# Patient Record
Sex: Female | Born: 1995 | Race: Black or African American | Hispanic: No | Marital: Single | Smoking: Current every day smoker
Health system: Southern US, Community
[De-identification: ages and names within clinical notes are randomized; demographics above are authoritative.]

---

## 2016-06-07 ENCOUNTER — Emergency Department (HOSPITAL_COMMUNITY): Payer: Self-pay

## 2016-06-07 ENCOUNTER — Encounter (HOSPITAL_COMMUNITY): Payer: Self-pay | Admitting: Emergency Medicine

## 2016-06-07 ENCOUNTER — Emergency Department (HOSPITAL_COMMUNITY)
Admission: EM | Admit: 2016-06-07 | Discharge: 2016-06-07 | Disposition: A | Discharge status: Home / Self Care | Payer: Self-pay | Attending: Emergency Medicine | Admitting: Emergency Medicine

## 2016-06-07 DIAGNOSIS — R52 Pain, unspecified: Secondary | ICD-10-CM

## 2016-06-07 DIAGNOSIS — Z79899 Other long term (current) drug therapy: Secondary | ICD-10-CM | POA: Insufficient documentation

## 2016-06-07 DIAGNOSIS — F17210 Nicotine dependence, cigarettes, uncomplicated: Secondary | ICD-10-CM | POA: Insufficient documentation

## 2016-06-07 DIAGNOSIS — Z3A01 Less than 8 weeks gestation of pregnancy: Secondary | ICD-10-CM | POA: Insufficient documentation

## 2016-06-07 DIAGNOSIS — O99011 Anemia complicating pregnancy, first trimester: Secondary | ICD-10-CM | POA: Insufficient documentation

## 2016-06-07 DIAGNOSIS — O039 Complete or unspecified spontaneous abortion without complication: Secondary | ICD-10-CM

## 2016-06-07 DIAGNOSIS — O200 Threatened abortion: Secondary | ICD-10-CM | POA: Insufficient documentation

## 2016-06-07 LAB — HCG, QUANTITATIVE, PREGNANCY: HCG, BETA CHAIN, QUANT, S: 7491 m[IU]/mL — AB (ref <5)

## 2016-06-07 LAB — CBC WITH DIFFERENTIAL/PLATELET
Basophils Absolute: 0 10*3/uL (ref 0.0–0.1)
Basophils Relative: 0 %
EOS PCT: 1 %
Eosinophils Absolute: 0.1 10*3/uL (ref 0.0–0.7)
HCT: 33.5 % — ABNORMAL LOW (ref 36.0–46.0)
HEMOGLOBIN: 10.7 g/dL — AB (ref 12.0–15.0)
LYMPHS ABS: 2 10*3/uL (ref 0.7–4.0)
LYMPHS PCT: 20 %
MCH: 20.9 pg — AB (ref 26.0–34.0)
MCHC: 31.9 g/dL (ref 30.0–36.0)
MCV: 65.3 fL — AB (ref 78.0–100.0)
MONO ABS: 0.8 10*3/uL (ref 0.1–1.0)
MONOS PCT: 8 %
Neutro Abs: 7.2 10*3/uL (ref 1.7–7.7)
Neutrophils Relative %: 71 %
Platelets: 409 10*3/uL — ABNORMAL HIGH (ref 150–400)
RBC: 5.13 MIL/uL — ABNORMAL HIGH (ref 3.87–5.11)
RDW: 20 % — ABNORMAL HIGH (ref 11.5–15.5)
WBC: 10.2 10*3/uL (ref 4.0–10.5)

## 2016-06-07 LAB — I-STAT BETA HCG BLOOD, ED (MC, WL, AP ONLY)

## 2016-06-07 LAB — URINE MICROSCOPIC-ADD ON

## 2016-06-07 LAB — WET PREP, GENITAL
SPERM: NONE SEEN
TRICH WET PREP: NONE SEEN
YEAST WET PREP: NONE SEEN

## 2016-06-07 LAB — URINALYSIS, ROUTINE W REFLEX MICROSCOPIC
Bilirubin Urine: NEGATIVE
GLUCOSE, UA: NEGATIVE mg/dL
Ketones, ur: 15 mg/dL — AB
LEUKOCYTES UA: NEGATIVE
NITRITE: NEGATIVE
PH: 5.5 (ref 5.0–8.0)
Protein, ur: NEGATIVE mg/dL
Specific Gravity, Urine: >1.03 — ABNORMAL HIGH (ref 1.005–1.030)

## 2016-06-07 LAB — PREGNANCY, URINE: Preg Test, Ur: POSITIVE — AB

## 2016-06-07 LAB — ABO/RH: ABO/RH(D): B POS

## 2016-06-07 NOTE — Discharge Instructions (Signed)
[facility]iscar[facility] tomorrow for an appointment in 2 days. You need a repeat blood test and US at that time. Return to the ED if you develop worsening pelvic pain, vaginal bleeding, dizziness or any other concerns. A miscarriage is the sudden loss of an unborn baby (fetus) before the 20th week of pregnancy. Most miscarriages happen in the first 3 months of pregnancy. Sometimes, it happens before a woman even knows she is pregnant. A miscarriage is also called a "spontaneous miscarriage" or "early pregnancy loss." Having a miscarriage can be an emotional experience. Talk with your caregiver about any questions you may have about miscarrying, the grieving process, and your future pregnancy plans. CAUSES   Problems with the fetal chromosomes that make it impossible for the baby to develop normally. Problems with the baby's genes or chromosomes are most often the result of errors that occur, by chance, as the embryo divides and grows. The problems are not inherited from the parents.  Infection of the cervix or uterus.   Hormone problems.   Problems with the cervix, such as having an incompetent cervix. This is when the tissue in the cervix is not strong enough to hold the pregnancy.   Problems with the uterus, such as an abnormally shaped uterus, uterine fibroids, or congenital abnormalities.   Certain medical conditions.   Smoking, drinking alcohol, or taking illegal drugs.   Trauma.  Often, the cause of a miscarriage is unknown.  SYMPTOMS   Vaginal bleeding or spotting, with or without cramps or pain.  Pain or cramping in the abdomen or lower back.  Passing fluid, tissue, or blood clots from the vagina. DIAGNOSIS  Your caregiver will perform a physical exam. You may also have an ultrasound to confirm the miscarriage. Blood or urine tests may also be ordered. TREATMENT   Sometimes, treatment is not necessary if you naturally pass all the fetal tissue that was in the  uterus. If some of the fetus or placenta remains in the body (incomplete miscarriage), tissue left behind may become infected and must be removed. Usually, a dilation and curettage (D and C) procedure is performed. During a D and C procedure, the cervix is widened (dilated) and any remaining fetal or placental tissue is gently removed from the uterus.  Antibiotic medicines are prescribed if there is an infection. Other medicines may be given to reduce the size of the uterus (contract) if there is a lot of bleeding.  If you have Rh negative blood and your baby was Rh positive, you will need a Rh immunoglobulin shot. This shot will protect any future baby from having Rh blood problems in future pregnancies. HOME CARE INSTRUCTIONS   Your caregiver may order bed rest or may allow you to continue light activity. Resume activity as directed by your caregiver.  Have someone help with home and family responsibilities during this time.   Keep track of the number of sanitary pads you use each day and how soaked (saturated) they are. Write down this information.   Do not use tampons. Do not douche or have sexual intercourse until approved by your caregiver.   Only take over-the-counter or prescription medicines for pain or discomfort as directed by your caregiver.   Do not take aspirin. Aspirin can cause bleeding.   Keep all follow-up appointments with your caregiver.   If you or your partner have problems with grieving, talk to your caregiver or seek counseling to help cope with the pregnancy loss. Allow enough time  to grieve before trying to get pregnant again.  SEEK IMMEDIATE MEDICAL CARE IF:   You have severe cramps or pain in your back or abdomen.  You have a fever.  You pass large blood clots (walnut-sized or larger) ortissue from your vagina. Save any tissue for your caregiver to inspect.   Your bleeding increases.   You have a thick, bad-smelling vaginal discharge.  You  become lightheaded, weak, or you faint.   You have chills.  MAKE SURE YOU:  Understand these instructions.  Will watch your condition.  Will get help right away if you are not doing well or get worse.   This information is not intended to replace advice given to you by your health care provider. Make sure you discuss any questions you have with your health care provider.   Document Released: 05/30/2001 Document Revised: 03/31/2013 Document Reviewed: 01/23/2012 Elsevier Interactive Patient Education [employer]

## 2016-06-07 NOTE — ED Notes (Addendum)
Pt c/o abd cramping and vaginal bleeding x 1 hour. Pt states she has not seen obgyn about pregnancy, her appointment is July 10th.

## 2016-06-07 NOTE — ED Provider Notes (Signed)
No IUP seen on US.  HCG 7500.  Rh+  D/w Dr. Emelda FearFerguson.  Suspect miscarriage, doubt ectopic pregnancy.  He recommends follow up in the office in 2 days. Repeat HCG and US at that time.  Patient stable, denies abdominal pain. Understands importance of follow up. Need to see OB in 2 days.  Return precautions discussed.  Glynn OctaveStephen Nami Strawder, MD 06/07/16 2303

## 2016-06-07 NOTE — ED Provider Notes (Signed)
CSN: [id number]     Arrival date & time 06/07/16  1948 History   First MD Initiated Contact with Patient 06/07/16 2012     Chief Complaint  Patient presents with  . Vaginal Bleeding     (Consider location/radiation/quality/duration/timing/severity/associated sxs/prior Treatment) HPI patient developed vaginal bleeding and lower abdominal nonradiating crampy pain 2 hours ago. She states she is used 1 pad since onset of bleeding. She is presently [redacted] weeks pregnant by dates. [facility]. Nothing makes symptoms better or worse. No other associated symptoms. Denies lightheadedness. Nothing makes symptoms better or worse. No fever. No urinary symptoms. No other associated symptoms  History reviewed. No pertinent past medical history. Past medical history negative History reviewed. No pertinent past surgical history. History reviewed. No pertinent family history. Social History  Substance Use Topics  . Smoking status: Current Every Day Smoker    Types: Cigarettes  . Smokeless tobacco: None  . Alcohol Use: No  Denies drug use OB History    Gravida Para Term Preterm AB TAB SAB Ectopic Multiple Living   1              Review of Systems  Constitutional: Negative.   HENT: Negative.   Respiratory: Negative.   Cardiovascular: Negative.   Gastrointestinal: Positive for abdominal pain.  Genitourinary: Positive for vaginal bleeding.       Pregnant  Musculoskeletal: Negative.   Skin: Negative.   Neurological: Negative.   Psychiatric/Behavioral: Negative.   All other systems reviewed and are negative.     Allergies  Review of patient's allergies indicates no known allergies.  Home Medications   Prior to Admission medications   Medication Sig Start Date End Date Taking? Authorizing Provider  Prenatal Vit-Fe Fumarate-FA (PRENATAL MULTIVITAMIN) TABS tablet Take 1 tablet by mouth daily at 12 noon.   Yes Historical Provider, MD   BP 124/69 mmHg  Pulse 90   Temp(Src) 98.9 F (37.2 C) (Oral)  Ht 5\' 4"  (1.626 m)  Wt 137 lb (62.143 kg)  BMI 23.50 kg/m2  SpO2 100%  LMP 04/17/2016 Physical Exam  Constitutional: She appears well-developed and well-nourished. No distress.  HENT:  Head: Normocephalic and atraumatic.  Eyes: Conjunctivae are normal. Pupils are equal, round, and reactive to light.  Neck: Neck supple. No tracheal deviation present. No thyromegaly present.  Cardiovascular: Normal rate and regular rhythm.   No murmur heard. Pulmonary/Chest: Effort normal and breath sounds normal.  Abdominal: Soft. Bowel sounds are normal. She exhibits no distension. There is no tenderness.  Genitourinary:  No external lesion. Dark blood in vaginal vault oozing from cervix. Cervical os closed. No cervical motion tenderness no adnexal masses or tenderness  Musculoskeletal: Normal range of motion. She exhibits no edema or tenderness.  Neurological: She is alert. Coordination normal.  Skin: Skin is warm and dry. No rash noted.  Psychiatric: She has a normal mood and affect.  Nursing note and vitals reviewed.   ED Course  Procedures (including critical care time) Labs Review Labs Reviewed  WET PREP, GENITAL  HCG, QUANTITATIVE, PREGNANCY  CBC WITH DIFFERENTIAL/PLATELET  BASIC METABOLIC PANEL  URINALYSIS, ROUTINE W REFLEX MICROSCOPIC (NOT AT [facility])  PREGNANCY, URINE  ABO/RH  GC/CHLAMYDIA PROBE AMP ([place]) NOT AT [facility]    Imaging Review No results found. I have personally reviewed and evaluated these images and lab results as part of my medical decision-making.   EKG Interpretation None      1010 pm . Pt is comfirtable, alert  in no disteress. Patient signed out to Dr.Rancour Results for orders placed or performed during the hospital encounter of 06/07/16  Wet prep, genital  Result Value Ref Range   Yeast Wet Prep HPF POC NONE SEEN NONE SEEN   Trich, Wet Prep NONE SEEN NONE SEEN   Clue Cells Wet Prep HPF POC PRESENT (A) NONE SEEN    WBC, Wet Prep HPF POC FEW (A) NONE SEEN   Sperm NONE SEEN   hCG, quantitative, pregnancy  Result Value Ref Range   hCG, Beta Chain, Quant, S 7491 (H) <5 mIU/mL  CBC with Differential/Platelet  Result Value Ref Range   WBC 10.2 4.0 - 10.5 K/uL   RBC 5.13 (H) 3.87 - 5.11 MIL/uL   Hemoglobin 10.7 (L) 12.0 - 15.0 g/dL   HCT 40.9 (L) 81.1 - 91.4 %   MCV 65.3 (L) 78.0 - 100.0 fL   MCH 20.9 (L) 26.0 - 34.0 pg   MCHC 31.9 30.0 - 36.0 g/dL   RDW 78.2 (H) 95.6 - 21.3 %   Platelets 409 (H) 150 - 400 K/uL   Neutrophils Relative % 71 %   Neutro Abs 7.2 1.7 - 7.7 K/uL   Lymphocytes Relative 20 %   Lymphs Abs 2.0 0.7 - 4.0 K/uL   Monocytes Relative 8 %   Monocytes Absolute 0.8 0.1 - 1.0 K/uL   Eosinophils Relative 1 %   Eosinophils Absolute 0.1 0.0 - 0.7 K/uL   Basophils Relative 0 %   Basophils Absolute 0.0 0.0 - 0.1 K/uL   WBC Morphology ATYPICAL LYMPHOCYTES    RBC Morphology ELLIPTOCYTES   Pregnancy, urine  Result Value Ref Range   Preg Test, Ur POSITIVE (A) NEGATIVE  I-Stat Beta hCG blood, ED ([facility], [facility], [facility] only)  Result Value Ref Range   I-stat hCG, quantitative >2000.0 (H) <5 mIU/mL   Comment 3          ABO/Rh  Result Value Ref Range   ABO/RH(D) B POS    No rh immune globuloin NOT A RH IMMUNE GLOBULIN CANDIDATE, PT RH POSITIVE    No results found.  MDM  Dx #1 vaginal bleeding during pregnancy #2 anemia Final diagnoses:  None        Doug Sou, MD 06/07/16 2211

## 2016-06-08 ENCOUNTER — Other Ambulatory Visit: Payer: Self-pay | Admitting: Adult Health

## 2016-06-08 DIAGNOSIS — O3680X0 Pregnancy with inconclusive fetal viability, not applicable or unspecified: Secondary | ICD-10-CM

## 2016-06-09 ENCOUNTER — Ambulatory Visit (INDEPENDENT_AMBULATORY_CARE_PROVIDER_SITE_OTHER): Payer: Medicaid Other

## 2016-06-09 DIAGNOSIS — O021 Missed abortion: Secondary | ICD-10-CM

## 2016-06-09 DIAGNOSIS — O3680X0 Pregnancy with inconclusive fetal viability, not applicable or unspecified: Secondary | ICD-10-CM

## 2016-06-09 LAB — RPR: RPR: NONREACTIVE

## 2016-06-09 LAB — GC/CHLAMYDIA PROBE AMP (~~LOC~~) NOT AT ARMC
CHLAMYDIA, DNA PROBE: NEGATIVE
Neisseria Gonorrhea: NEGATIVE

## 2016-06-09 LAB — HIV ANTIBODY (ROUTINE TESTING W REFLEX): HIV SCREEN 4TH GENERATION: NONREACTIVE

## 2016-06-09 NOTE — Progress Notes (Signed)
PELVIC US TV: no IUP seen,complex thickened endometrium w/color flow 2.5 cm,normal ov's bilat,small amount of simple cul de sac fluid,[facility].

## 2016-06-12 ENCOUNTER — Ambulatory Visit: Payer: Self-pay | Admitting: Obstetrics and Gynecology

## 2016-06-12 ENCOUNTER — Telehealth: Payer: Self-pay | Admitting: Obstetrics and Gynecology

## 2016-06-12 ENCOUNTER — Telehealth: Payer: Self-pay | Admitting: *Deleted

## 2016-06-12 ENCOUNTER — Encounter: Payer: Self-pay | Admitting: Obstetrics and Gynecology

## 2016-06-12 ENCOUNTER — Ambulatory Visit (INDEPENDENT_AMBULATORY_CARE_PROVIDER_SITE_OTHER): Payer: Medicaid Other | Admitting: Obstetrics and Gynecology

## 2016-06-12 ENCOUNTER — Other Ambulatory Visit: Payer: Self-pay

## 2016-06-12 VITALS — BP 120/80 | Ht 64.0 in | Wt 136.0 lb

## 2016-06-12 DIAGNOSIS — O039 Complete or unspecified spontaneous abortion without complication: Secondary | ICD-10-CM

## 2016-06-12 MED ORDER — NORGESTIMATE-ETH ESTRADIOL 0.25-35 MG-MCG PO TABS: 1.0000 | ORAL_TABLET | Freq: Every day | ORAL | Status: AC

## 2016-06-12 MED ORDER — NORGESTIMATE-ETH ESTRADIOL 0.25-35 MG-MCG PO TABS: 1.0000 | ORAL_TABLET | Freq: Every day | ORAL | Status: DC

## 2016-06-12 NOTE — Progress Notes (Signed)
Patient ID: Meagan Dearalorea Shedden, female   DOB: [birth year], [age] y.o.   MRN: [id number]  Work in pt    [facility]  @DATE @            Patient name: Meagan Leon MRN [id number]  Date of birth: [birth year]  CC & HPI:  Meagan Leon is a 20 y.o. female presenting today for serum HCG. She was seen in the ED on 06/07/16 for evaluation of central pelvic cramping and heavy vaginal bleeding and had positive urine preg at this time. She states that she passed a clot the size of a softball last night, followed by the cessation of her cramping. Pt states she is currently pain-free. Pt is currently sexually active and does not regularly use protection. She has been with her current partner for 2 years.   ROS:  Review of Systems  Gastrointestinal: Positive for abdominal pain (resolved).  Genitourinary:       +vaginal bleeding  All other systems reviewed and are negative.    Pertinent History Reviewed:   Reviewed Medical        No past medical history on file.                            Surgical Hx:   No past surgical history on file. Medications: Reviewed & Updated - see associated section                       Current outpatient prescriptions:  .  Prenatal Vit-Fe Fumarate-FA (PRENATAL MULTIVITAMIN) TABS tablet, Take 1 tablet by mouth daily at 12 noon., Disp: , Rfl:    Social History: Reviewed -  reports that she has been smoking Cigarettes.  She does not have any smokeless tobacco history on file.  Objective Findings:  Vitals: Blood pressure 120/80, height 5\' 4"  (1.626 m), weight 136 lb (61.689 kg), last menstrual period 04/17/2016.  Physical Examination: Discussion only   Discussed with pt likelihood of miscarriage and importance of contraceptives. At end of discussion, pt had opportunity to ask questions and has no further questions at this time.   Greater than 50% was spent in counseling and coordination of care with the patient. Total time greater than: 15 minutes    Assessment & Plan:   A:  1. Central lower abdominal cramping for 5 days, currently resolved 2. Vaginal bleeding for 5 days  3. Probable missed abortion   P:  1. Rx Sprintec, to be taken if HCG is negative today  2. F/u for repeat HCG in 1 week if result is positive 3. F/u in 1 month  4. Pt advised to sign up for MyChart     By signing my name below, I, Doreatha MartinEva Mathews, attest that this documentation has been prepared under the direction and in the presence of Tilda BurrowJohn V Khristian Phillippi, MD. Electronically Signed: Doreatha MartinEva Mathews, ED Scribe. 06/12/2016. 10:06 AM.  I personally performed the services described in this documentation, which was SCRIBED in my presence. The recorded information has been reviewed and considered accurate. It has been edited as necessary during review. Tilda BurrowFERGUSON,Nikolaus Pienta V, MD

## 2016-06-12 NOTE — Telephone Encounter (Signed)
[facility] [place] Rx sent to pharmacy.

## 2016-06-12 NOTE — Telephone Encounter (Signed)
[facility] [place] [facility].

## 2016-06-13 ENCOUNTER — Telehealth: Payer: Self-pay | Admitting: Obstetrics and Gynecology

## 2016-06-13 LAB — BETA HCG QUANT (REF LAB): HCG QUANT: 2610 m[IU]/mL

## 2016-06-13 NOTE — Telephone Encounter (Signed)
Please call pt and she wants to get her test results

## 2016-06-13 NOTE — Telephone Encounter (Signed)
Pt informed her HCG has dropped from 7491 to 2610.  Pt is still bleeding and passed two large clots today.  Does pt need to repeat BHCG and if so when.  Please advise

## 2016-06-15 ENCOUNTER — Other Ambulatory Visit: Payer: Self-pay

## 2016-06-15 DIAGNOSIS — O039 Complete or unspecified spontaneous abortion without complication: Secondary | ICD-10-CM

## 2016-06-16 ENCOUNTER — Telehealth: Payer: Self-pay | Admitting: Obstetrics and Gynecology

## 2016-06-16 DIAGNOSIS — O039 Complete or unspecified spontaneous abortion without complication: Secondary | ICD-10-CM | POA: Insufficient documentation

## 2016-06-16 LAB — BETA HCG QUANT (REF LAB): hCG Quant: 1721 m[IU]/mL

## 2016-06-16 NOTE — Telephone Encounter (Signed)
Pt informed that HCG continues to drop 7491->2610-> 1700 on 6/29.  Pt advised to go ahead an start OCP this weekend, and expect intermittent bleeding to continue for a few more days. Any pain in either side to be evaluated further.

## 2016-06-16 NOTE — Progress Notes (Signed)
Cancelled appt

## 2016-06-16 NOTE — Telephone Encounter (Signed)
[facility] and to go ahead and start the OCP this weekend per Dr.Ferguson, and to expect some bleeding over the next few days and to call if she has pain on one side more than the other. Pt verbalized understanding.

## 2016-07-10 ENCOUNTER — Encounter: Payer: Self-pay | Admitting: Obstetrics and Gynecology

## 2016-07-10 ENCOUNTER — Ambulatory Visit (INDEPENDENT_AMBULATORY_CARE_PROVIDER_SITE_OTHER): Payer: Medicaid Other | Admitting: Obstetrics and Gynecology

## 2016-07-10 VITALS — BP 110/50 | Ht 64.0 in | Wt 137.0 lb

## 2016-07-10 DIAGNOSIS — Z3202 Encounter for pregnancy test, result negative: Secondary | ICD-10-CM

## 2016-07-10 DIAGNOSIS — O039 Complete or unspecified spontaneous abortion without complication: Secondary | ICD-10-CM

## 2016-07-10 LAB — POCT URINE PREGNANCY: PREG TEST UR: NEGATIVE

## 2016-07-10 NOTE — Progress Notes (Signed)
   [facility] Visit  @DATE @            Patient name: Meagan Leon MRN [id number]  Date of birth: 1996-02-01  CC & HPI:  Meagan Leon is a 20 y.o. female presenting today for follow up on SAB. Reports minor bleeding She has started OCP. Reports good compliance with her OCP. Tolerating her OCP well. Preg test is negative today.  ROS:  ROS Per HPI  Pertinent History Reviewed:   Medical        History reviewed. No pertinent past medical history.                            Surgical Hx:   History reviewed. No pertinent surgical history. Medications: Reviewed & Updated - see associated section                       Current Outpatient Prescriptions:  .  norgestimate-ethinyl estradiol (ORTHO-CYCLEN,SPRINTEC,PREVIFEM) 0.25-35 MG-MCG tablet, Take 1 tablet by mouth daily., Disp: 1 Package, Rfl: 11   Social History: Reviewed -  reports that she has been smoking Cigarettes.  She has a 0.50 pack-year smoking history. She has never used smokeless tobacco.  Objective Findings:  Vitals: Blood pressure (!) 110/50, height 5\' 4"  (1.626 m), weight 137 lb (62.1 kg), last menstrual period 06/07/2016, unknown if currently breastfeeding.  Physical Examination: General appearance - alert, well appearing, and in no distress   Assessment & Plan:   A: Status post SAB. Preg test negative today. Reports good compliance. Tolerating OCP well.  P: Continue OCP

## 2016-08-10 ENCOUNTER — Ambulatory Visit (INDEPENDENT_AMBULATORY_CARE_PROVIDER_SITE_OTHER): Payer: Medicaid Other | Admitting: Obstetrics and Gynecology

## 2016-08-10 ENCOUNTER — Encounter: Payer: Self-pay | Admitting: Obstetrics and Gynecology

## 2016-08-10 DIAGNOSIS — N938 Other specified abnormal uterine and vaginal bleeding: Secondary | ICD-10-CM

## 2016-08-10 DIAGNOSIS — N921 Excessive and frequent menstruation with irregular cycle: Secondary | ICD-10-CM | POA: Diagnosis not present

## 2016-08-10 MED ORDER — ESTRADIOL 1 MG PO TABS: 1.0000 mg | ORAL_TABLET | Freq: Every day | ORAL | 2 refills | Status: AC

## 2016-08-10 NOTE — Progress Notes (Signed)
   [facility] Visit  08/10/16           Patient name: Meagan Leon MRN [id number]  Date of birth: [birth year]  CC & HPI:  Meagan Dearalorea Showers is a 20 y.o. female presenting today for vaginal bleeding onset 1 month. Pt states that she is on her last week of her Sprintec pack and she has been bleeding for the past month. Pt states that she had a miscarriage 2 months ago and was started on OCP (Sprintec) last month. She notes that she uses tampons during her menstrual. Pt has not tried any medications for the relief of her symptoms. Pt denies fever, chills, and any other symptoms.   ROS:  ROS A complete 10 system review of systems was obtained and all systems are negative except as noted in the HPI and PMH.   Pertinent History Reviewed:   Reviewed: Significant for  Medical        History reviewed. No pertinent past medical history.                            Surgical Hx:   History reviewed. No pertinent surgical history. Medications: Reviewed & Updated - see associated section                       Current Outpatient Prescriptions:  .  norgestimate-ethinyl estradiol (ORTHO-CYCLEN,SPRINTEC,PREVIFEM) 0.25-35 MG-MCG tablet, Take 1 tablet by mouth daily., Disp: 1 Package, Rfl: 11   Social History: Reviewed -  reports that she has been smoking Cigarettes.  She has a 1.00 pack-year smoking history. She has never used smokeless tobacco.  Objective Findings:  Vitals: Blood pressure 120/76, pulse 74, height 5\' 3"  (1.6 m), weight 135 lb (61.2 kg), not currently breastfeeding.  Physical Examination:  Pelvic - normal external genitalia, vulva, vagina, cervix, uterus and adnexa,  VULVA: normal appearing vulva with no masses, tenderness or lesions,  VAGINA: normal appearing vagina with normal color and discharge, no lesions,  CERVIX: normal appearing cervix without discharge or lesions,  UTERUS: uterus is normal size, shape, consistency and nontender,    Assessment & Plan:   A:  1.  Breakthrough bleeding on BCP  P:  1. Rx 1 mg estradiol on days of BTB 2. Follow up PRN after 1 month.    By signing my name below, I, Soijett Blue, attest that this documentation has been prepared under the direction and in the presence of Tilda BurrowJohn V Evelean Bigler, MD. Electronically Signed: Soijett Blue, ED Scribe. 08/10/16. 2:00 PM   . I personally performed the services described in this documentation, which was SCRIBED in my presence. The recorded information has been reviewed and considered accurate. It has been edited as necessary during review. Tilda BurrowFERGUSON,Jeran Hiltz V, MD     I personally performed the services described in this documentation, which was SCRIBED in my presence. The recorded information has been reviewed and considered accurate. It has been edited as necessary during review. Tilda BurrowFERGUSON,Shaundra Fullam V, MD

## 2016-11-09 IMAGING — US US OB COMP LESS 14 WK
1 series · 13 of 28 positions shown · non-contrast
Comparison: None.

CLINICAL DATA: 19-year-old female with positive HCG levels
presenting with abdominal cramping.

EXAM:
OBSTETRIC <14 WK US AND TRANSVAGINAL OB US
TECHNIQUE: Both transabdominal and transvaginal ultrasound examinations were
performed for complete evaluation of the gestation as well as the
maternal uterus, adnexal regions, and pelvic cul-de-sac.
Transvaginal technique was performed to assess early pregnancy.

[Series 1: us ob comp less 14 wk · 0.25mm/px · 13 of 47 slices shown]
[im 2/47]
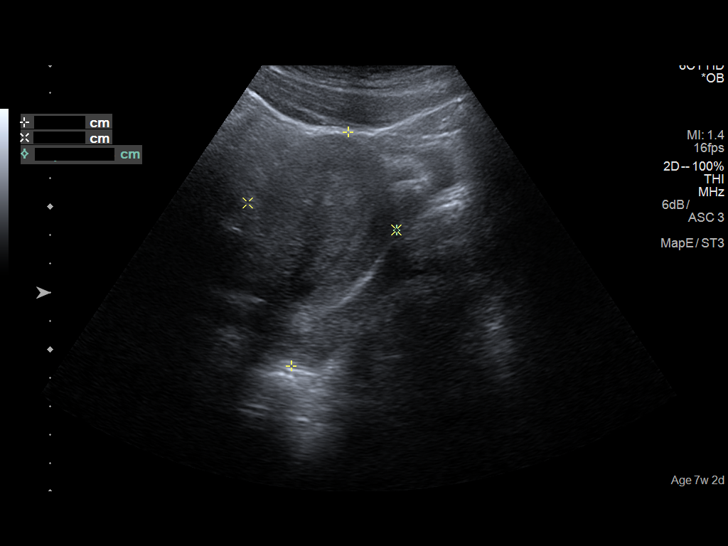
[im 6/47]
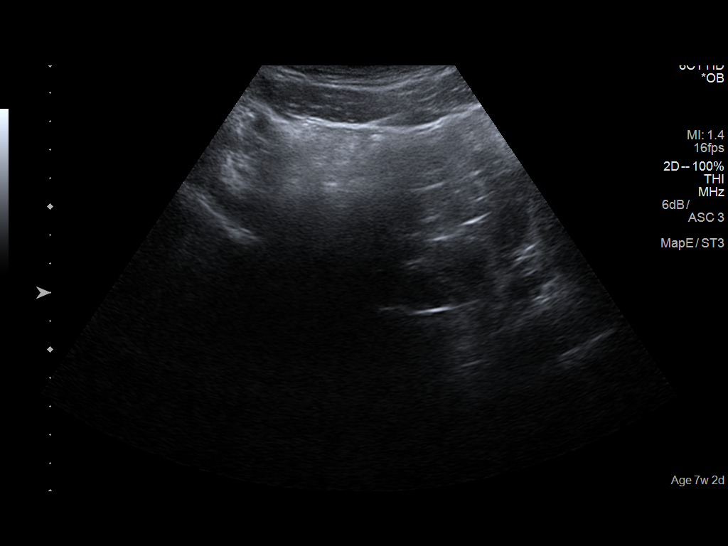
[im 9/47]
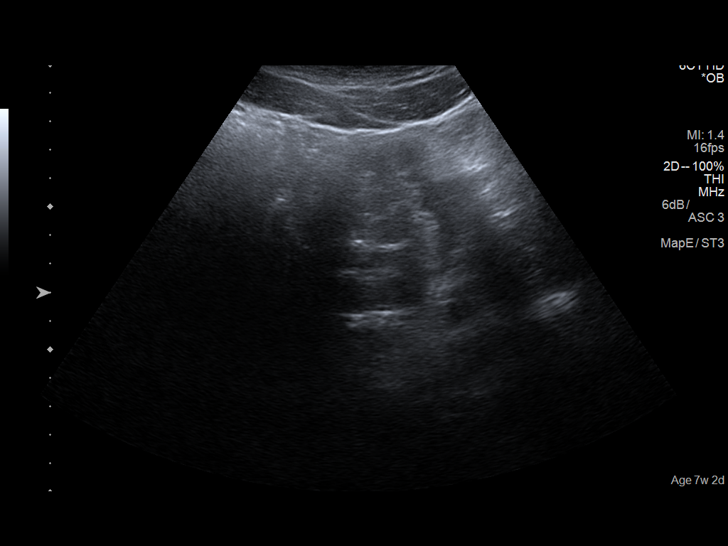
[im 12/47]
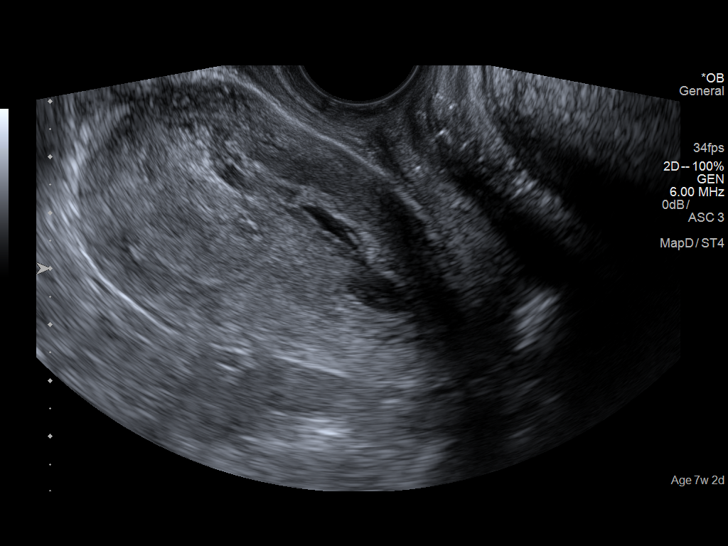
[im 16/47]
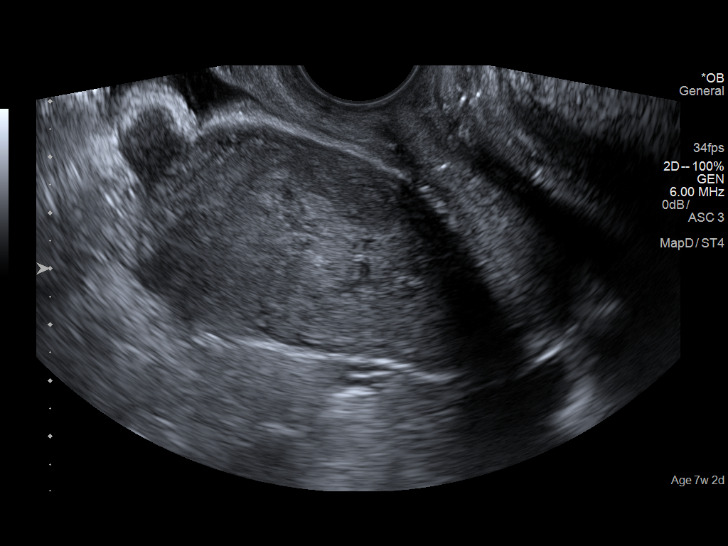
[im 19/47]
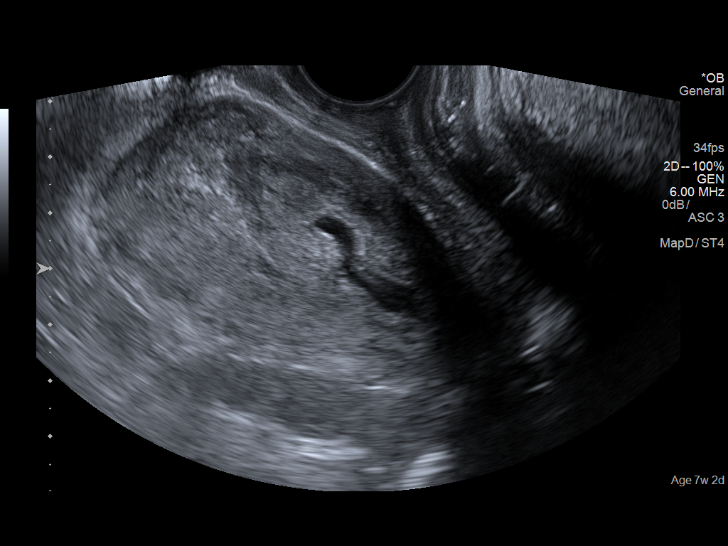
[im 24/47]
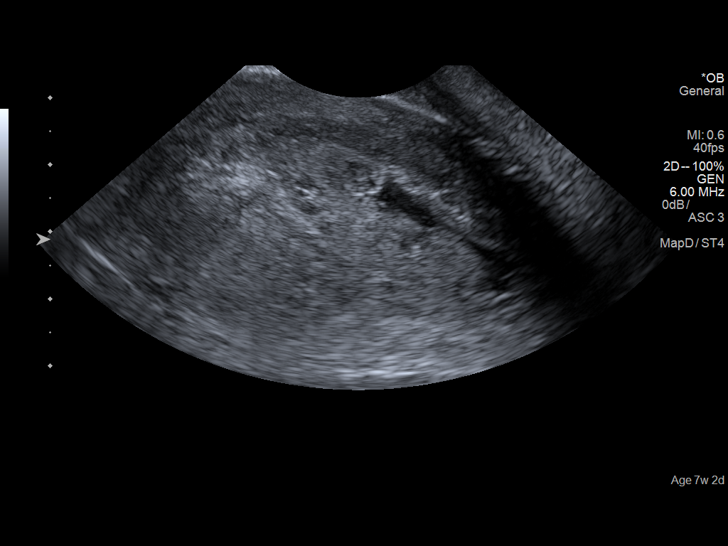
[im 28/47]
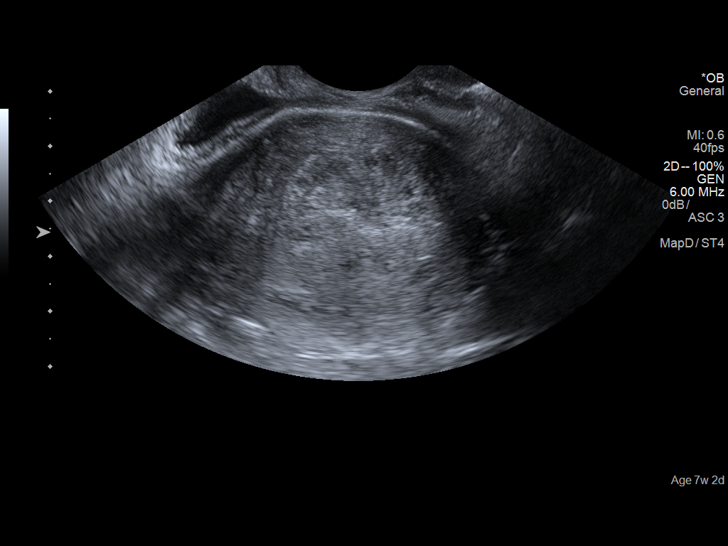
[im 31/47]
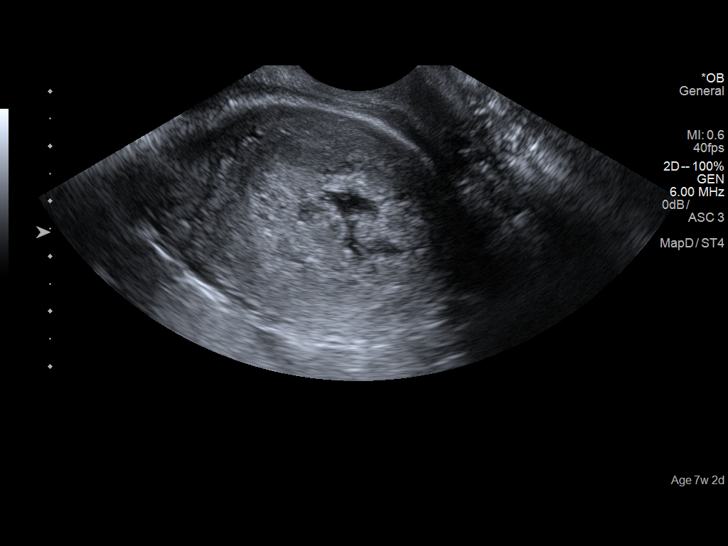
[im 35/47]
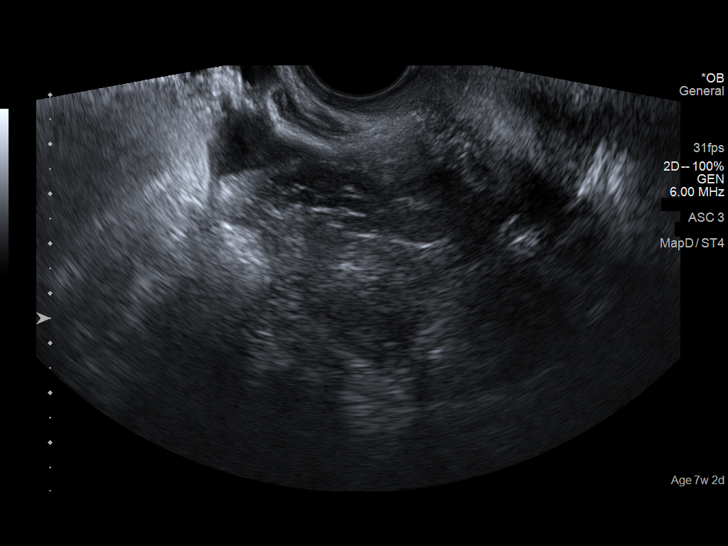
[im 38/47]
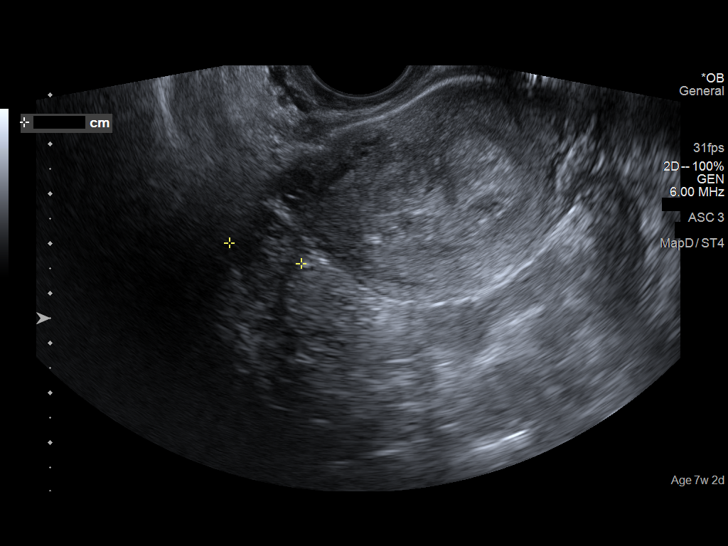
[im 41/47]
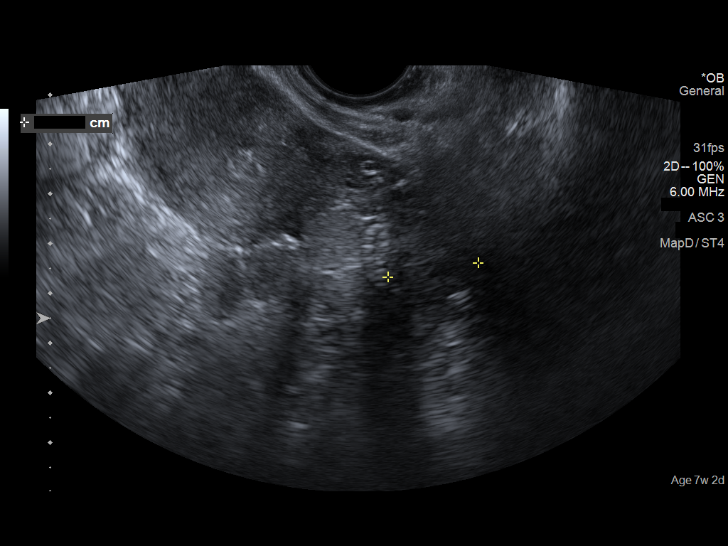
[im 45/47]
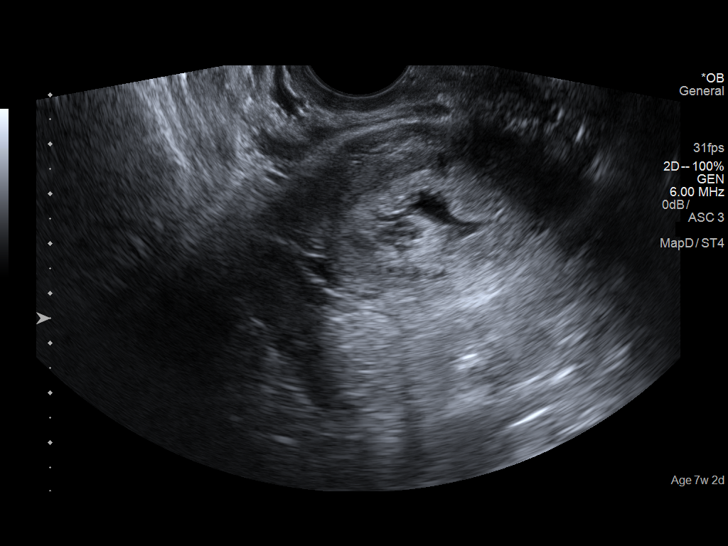

[13 of 28 positions shown; findings below may reference images not displayed]

FINDINGS: The uterus is anteverted and measures 8.4 x 4.5 x 5.3 cm. No
intrauterine pregnancy identified. The endometrium is thickened.
There is complex echogenic content within the endometrial canal
measuring approximately 2 cm in thickness and likely represent blood
product.

The right ovary measures 2.4 x 1.6 x 1.5 cm and the left ovary
measures 2.1 x 1.7 x 1.8 cm.

Small amount of free fluid noted within the pelvis.
IMPRESSION: No intrauterine pregnancy identified.

Complex echogenic content within the endometrial canal, likely blood
product.

Please note with positive HCG levels and in the absence of
documented IUP, the possibility of an ectopic pregnancy is not
entirely excluded. Correlation with clinical exam and follow-up with
serial HCG levels and ultrasound recommended.
